# Patient Record
Sex: Female | Born: 1975 | Race: Black or African American | Hispanic: No | Marital: Single | Smoking: Never smoker
Health system: Southern US, Community
[De-identification: ages and names within clinical notes are randomized; demographics above are authoritative.]

## PROBLEM LIST (undated history)

## (undated) DIAGNOSIS — I10 Essential (primary) hypertension: Secondary | ICD-10-CM

## (undated) HISTORY — DX: Essential (primary) hypertension: I10

## (undated) HISTORY — PX: CHOLECYSTECTOMY: SHX55

---

## 2009-11-21 ENCOUNTER — Emergency Department (HOSPITAL_BASED_OUTPATIENT_CLINIC_OR_DEPARTMENT_OTHER): Admission: EM | Admit: 2009-11-21 | Discharge: 2009-11-21 | Payer: Self-pay | Admitting: Emergency Medicine

## 2009-11-21 ENCOUNTER — Ambulatory Visit: Payer: Self-pay | Admitting: Diagnostic Radiology

## 2010-04-14 ENCOUNTER — Emergency Department (HOSPITAL_BASED_OUTPATIENT_CLINIC_OR_DEPARTMENT_OTHER)
Admission: EM | Admit: 2010-04-14 | Discharge: 2010-04-14 | Payer: Self-pay | Source: Home / Self Care | Admitting: Emergency Medicine

## 2010-06-12 LAB — COMPREHENSIVE METABOLIC PANEL
AST: 18 U/L (ref 0–37)
BUN: 9 mg/dL (ref 6–23)
Calcium: 8.9 mg/dL (ref 8.4–10.5)
Chloride: 106 mEq/L (ref 96–112)
Creatinine, Ser: 0.7 mg/dL (ref 0.4–1.2)
Potassium: 3.5 mEq/L (ref 3.5–5.1)
Sodium: 143 mEq/L (ref 135–145)

## 2010-06-12 LAB — URINALYSIS, ROUTINE W REFLEX MICROSCOPIC
Bilirubin Urine: NEGATIVE
pH: 6 (ref 5.0–8.0)

## 2010-06-12 LAB — CBC
MCH: 23.3 pg — ABNORMAL LOW (ref 26.0–34.0)
MCV: 70.1 fL — ABNORMAL LOW (ref 78.0–100.0)
Platelets: 314 10*3/uL (ref 150–400)
RDW: 14.4 % (ref 11.5–15.5)

## 2010-06-12 LAB — URINE MICROSCOPIC-ADD ON

## 2010-06-12 LAB — DIFFERENTIAL
Eosinophils Absolute: 0.3 10*3/uL (ref 0.0–0.7)
Lymphs Abs: 2.7 10*3/uL (ref 0.7–4.0)
Monocytes Absolute: 0.5 10*3/uL (ref 0.1–1.0)
Monocytes Relative: 6 % (ref 3–12)

## 2010-06-12 LAB — LIPASE, BLOOD: Lipase: 93 U/L (ref 23–300)

## 2011-04-10 IMAGING — CR DG LUMBAR SPINE COMPLETE 4+V
5 series · 5 of 5 positions shown · non-contrast
Comparison: None.

CLINICAL DATA: Low back pain after falling downstairs.

LUMBAR SPINE - COMPLETE 4+ VIEW

[t l-spine a.p.]
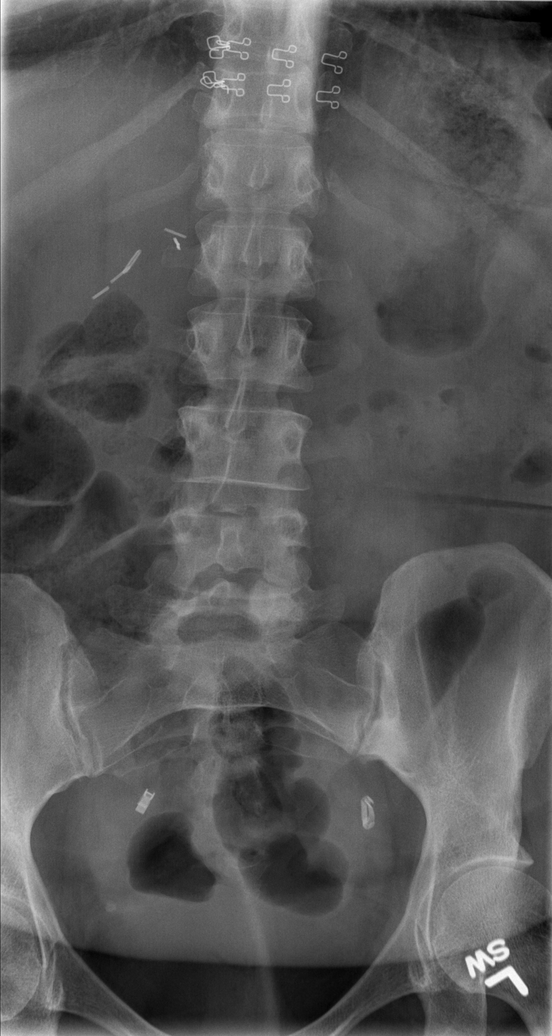

[t l-spine oblique exposure (1 of 2)]
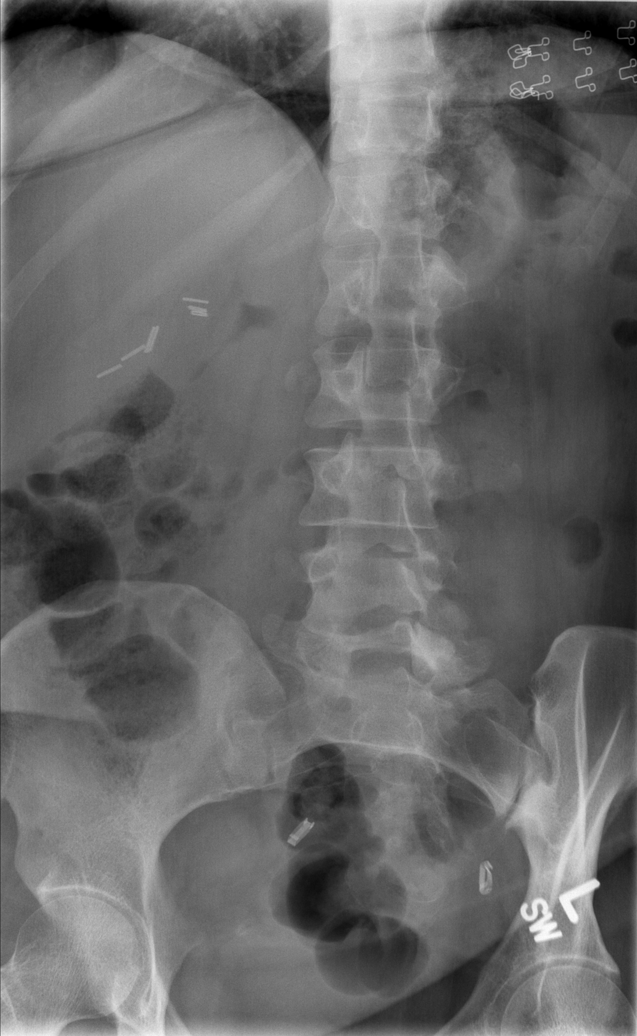

[t l-spine oblique exposure (2 of 2)]
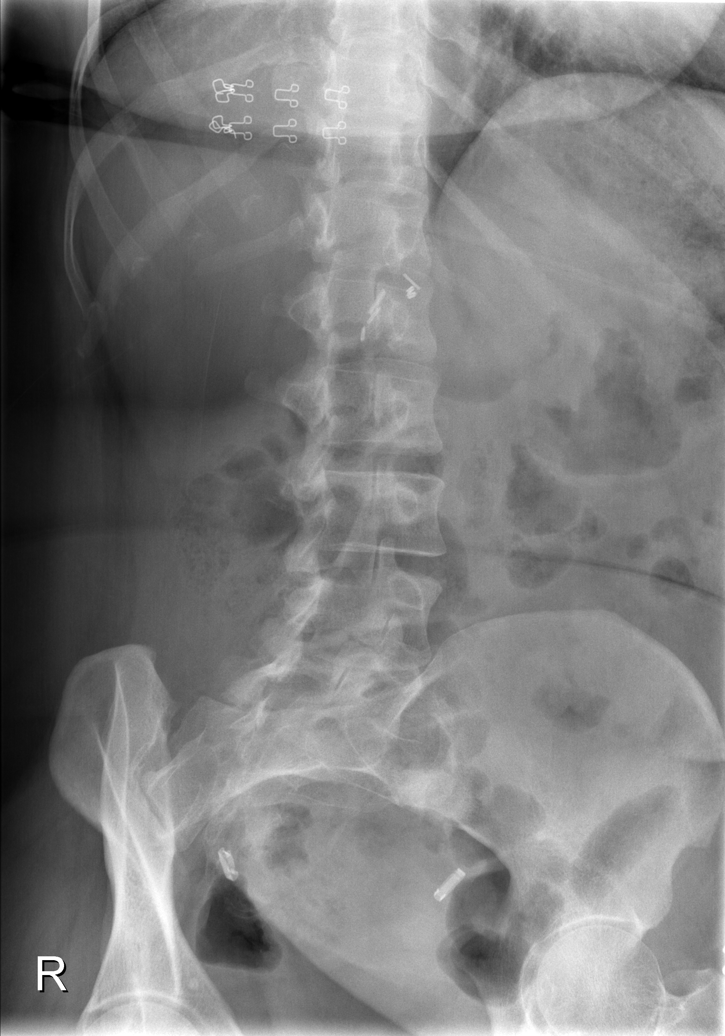

[t l-spine lat]
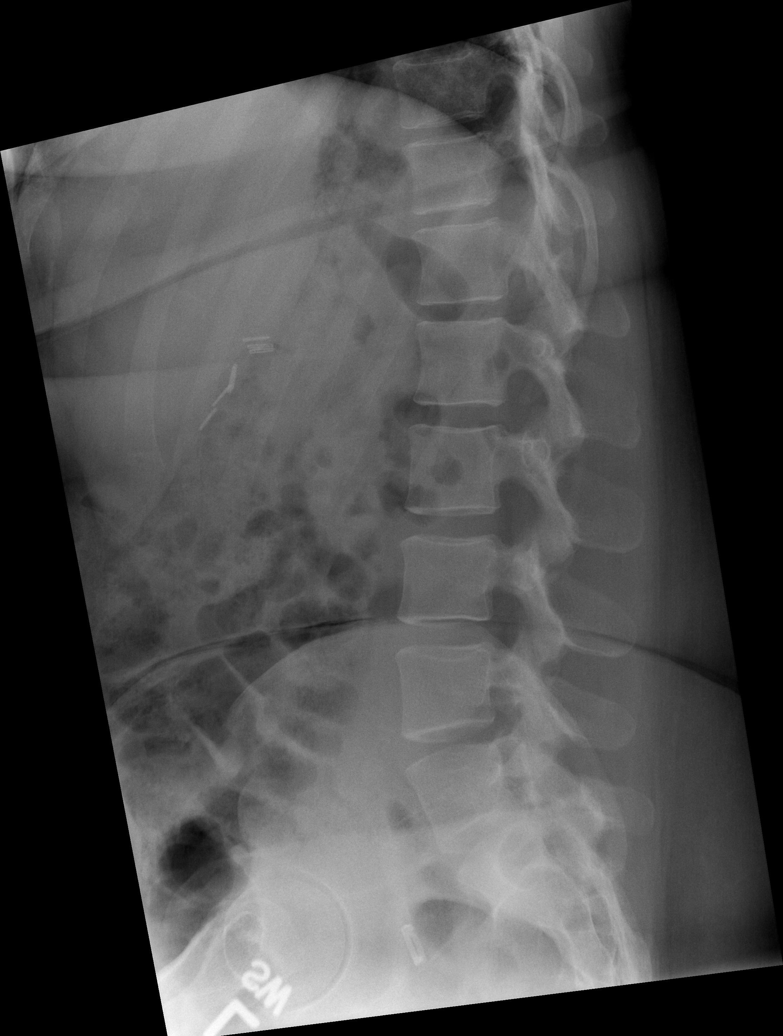

[t l-spine l5-s1 spot]
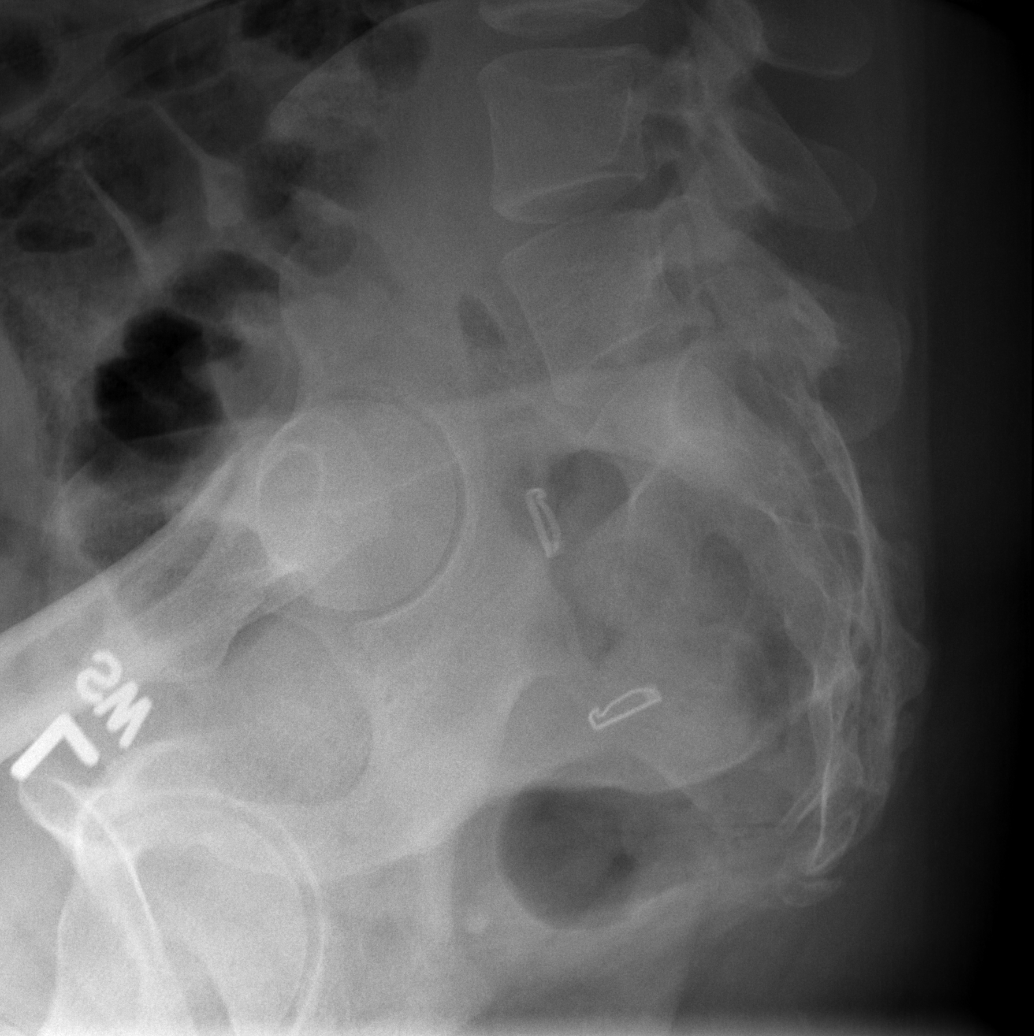

[5 of 5 positions shown; findings below may reference images not displayed]

FINDINGS: No acute fracture or subluxation identified.  There is
mild disc space narrowing at L5-S1 with associated facet
hypertrophy.  No bony lesions.
IMPRESSION: No acute fracture identified.  Mild spondylosis at L5-S1.

## 2011-09-11 IMAGING — CR DG CHEST 2V
2 series · 2 of 2 positions shown · non-contrast
Comparison: None.

CLINICAL DATA: Left-sided abdominal pain.

CHEST - 2 VIEW 11/21/2009:

[w chest pa]
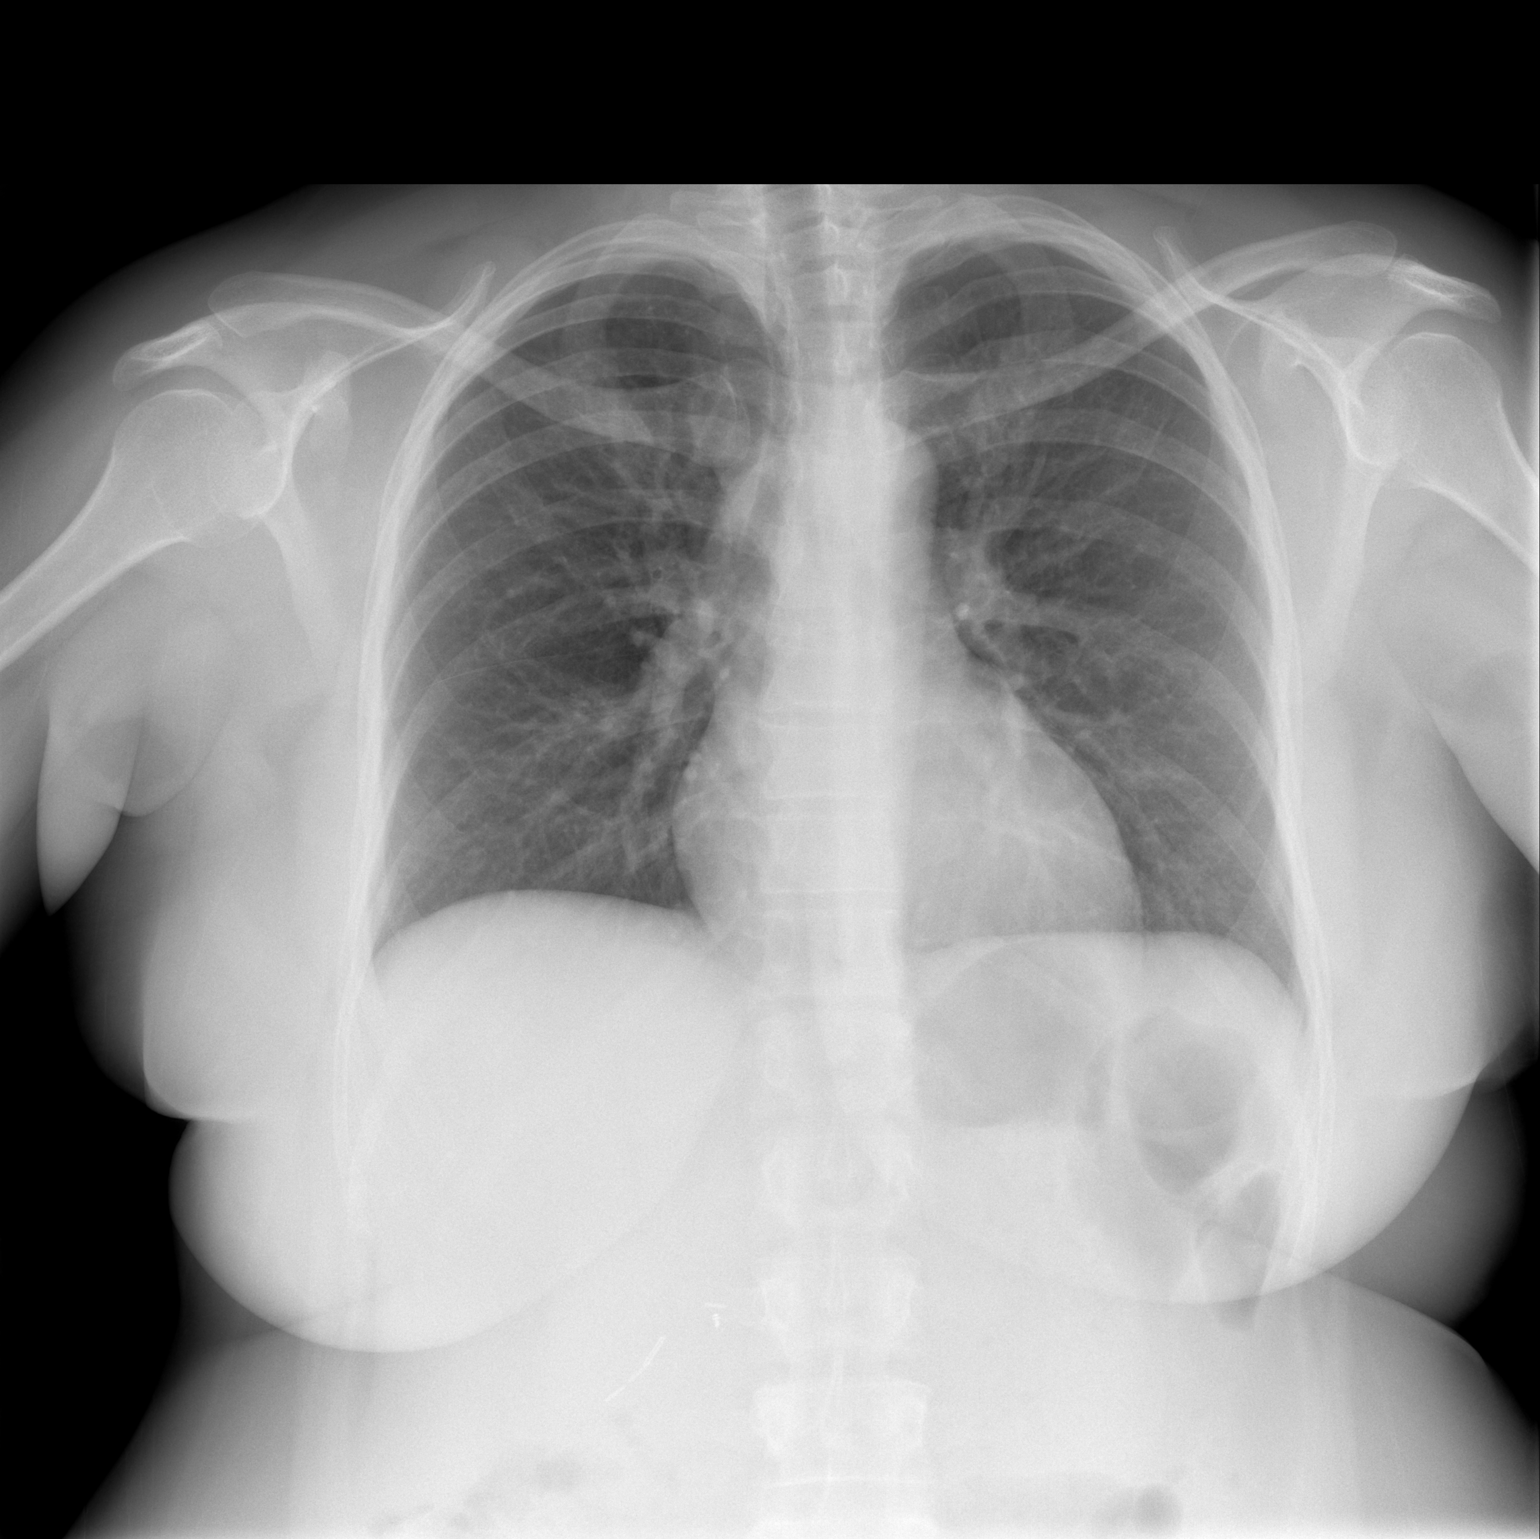

[w chest lat]
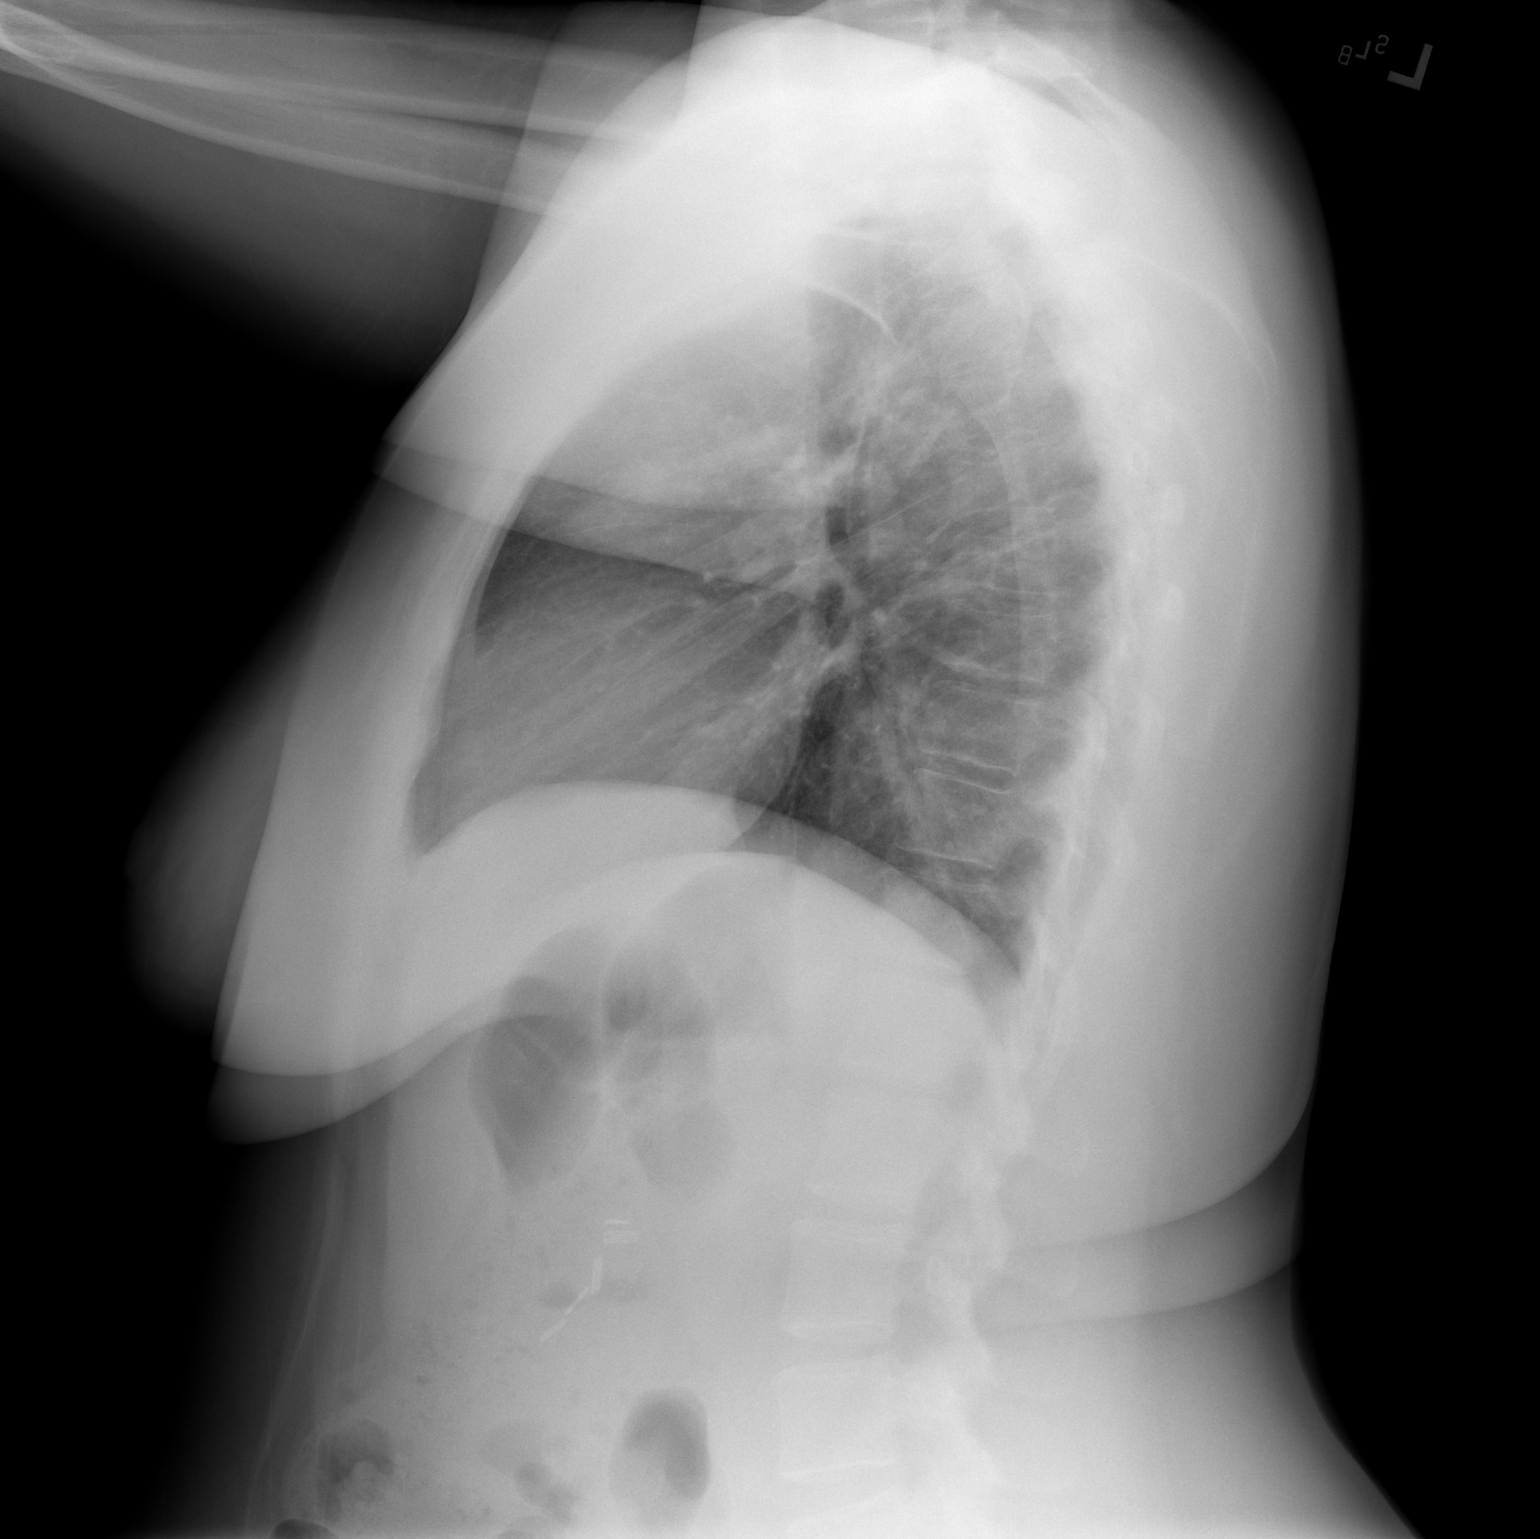

[2 of 2 positions shown; findings below may reference images not displayed]

FINDINGS: Cardiomediastinal silhouette unremarkable.  Lungs clear.
Bronchovascular markings normal.  Pulmonary vascularity normal.  No
pleural effusions.  No pneumothorax.  Visualized bony thorax
intact.
IMPRESSION: Normal chest x-ray.

## 2011-11-01 ENCOUNTER — Emergency Department (HOSPITAL_BASED_OUTPATIENT_CLINIC_OR_DEPARTMENT_OTHER)
Admission: EM | Admit: 2011-11-01 | Discharge: 2011-11-01 | Disposition: A | Discharge status: Home / Self Care | Payer: Worker's Compensation | Attending: Emergency Medicine | Admitting: Emergency Medicine

## 2011-11-01 ENCOUNTER — Encounter (HOSPITAL_BASED_OUTPATIENT_CLINIC_OR_DEPARTMENT_OTHER): Payer: Self-pay | Admitting: *Deleted

## 2011-11-01 DIAGNOSIS — W461XXA Contact with contaminated hypodermic needle, initial encounter: Secondary | ICD-10-CM

## 2011-11-01 DIAGNOSIS — Z7721 Contact with and (suspected) exposure to potentially hazardous body fluids: Secondary | ICD-10-CM | POA: Insufficient documentation

## 2011-11-01 NOTE — ED Notes (Signed)
Spoke to [facility] at [facility] hospital about whether we draw the post-exposure lab work. Stated that we do not draw the labs or treat for a post exposure for another facility.. The facility needs to f/u with their occupational health as needed, and wait to see what the nursing home patient's (the one who's needle stuck ms. Sherryll Burger) lab work results show. ED providers made aware.

## 2011-11-01 NOTE — ED Notes (Signed)
Patient reports that after administering insulin to resident this pm she stuck that same needle into left thumb. Patient reports that it did bleed and she washed the site well. Employer sent patient here for possible collection of exposure labs. No complaints

## 2011-11-01 NOTE — ED Provider Notes (Signed)
History     CSN: [id number]  Arrival date & time 11/01/11  2128   First MD Initiated Contact with Patient 11/01/11 2211      Chief Complaint  Patient presents with  . 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 She is unaware of the hepatitis and HIV status of the patient. Her supervisor advised her to come to [facility] to get blood drawn. She denies any current fever, nausea, vomiting, diarrhea.    History reviewed. No pertinent past medical history.  Past Surgical History  Procedure Date  . Cholecystectomy     No family history on file.  History  Substance Use Topics  . Smoking status: Never Smoker   . Smokeless tobacco: Not on file  . Alcohol Use: No    OB History    Grav Para Term Preterm Abortions TAB SAB Ect Mult Living                  Review of Systems  Constitutional: Negative for fever and chills.  Eyes: Negative for visual disturbance.  Respiratory: Negative for shortness of breath.   Cardiovascular: Negative for chest pain.  Gastrointestinal: Negative for nausea, vomiting, abdominal pain and diarrhea.  Skin: Positive for wound.  Neurological: Negative for headaches.    Allergies  Penicillins  Home Medications   Current Outpatient Rx  Name Route Sig Dispense Refill  . ADULT MULTIVITAMIN W/MINERALS CH Oral Take 1 tablet by mouth daily.      BP 131/74  Pulse 80  Temp 99 F (37.2 C) (Oral)  Resp 16  Ht 5' 2.5" (1.588 m)  Wt 180 lb (81.647 kg)  BMI 32.40 kg/m2  SpO2 98%  Physical Exam  Constitutional: She appears well-developed and well-nourished. No distress.  HENT:  Head: Normocephalic and atraumatic.  Eyes: Conjunctivae are normal. No scleral icterus.  Neck: Normal range  of motion.  Cardiovascular: Normal rate and regular rhythm.  Exam reveals no gallop and no friction rub.   No murmur heard. Pulmonary/Chest: Effort normal. No respiratory distress. She has no wheezes.  Musculoskeletal: Normal range of motion.  Neurological: She is alert.  Skin: Skin is warm. She is not diaphoretic.  Psychiatric: She has a normal mood and affect. Her behavior is normal.    ED Course  Procedures (including critical care time)  Labs Reviewed - No data to display No results found.   No diagnosis found.    MDM  10:55 PM I spoke with the charge nurse about evaluating this patient for hepatitis/HIV after a dirty needle stick. This facility does not evaluate needle sticks from other facilities or do HIV testing. I will advise the patient to follow up with her facility's policy regarding a needle stick and to follow up with the health department if needed. We cannot evaluate this patient's hepatitis/HIV status so she will be discharged and advised to follow up with her facility's policy. I will provide Health Department contact info. Plan discussed with Dr. Karma Ganja who is agreeable.         Emilia Beck, PA-C 11/01/11 2258

## 2011-11-01 NOTE — ED Notes (Signed)
Stuck her left thumb with an insulin syringe after giving patient an injection.

## 2011-11-01 NOTE — ED Provider Notes (Signed)
Medical screening examination/treatment/procedure(s) were performed by non-physician practitioner and as supervising physician I was immediately available for consultation/collaboration.  Ethelda Chick, MD 11/01/11 [phone]

## 2015-04-16 ENCOUNTER — Emergency Department (HOSPITAL_BASED_OUTPATIENT_CLINIC_OR_DEPARTMENT_OTHER)
Admission: EM | Admit: 2015-04-16 | Discharge: 2015-04-16 | Disposition: A | Discharge status: Home / Self Care | Payer: Self-pay | Attending: Emergency Medicine | Admitting: Emergency Medicine

## 2015-04-16 ENCOUNTER — Encounter (HOSPITAL_BASED_OUTPATIENT_CLINIC_OR_DEPARTMENT_OTHER): Payer: Self-pay

## 2015-04-16 DIAGNOSIS — Z79899 Other long term (current) drug therapy: Secondary | ICD-10-CM | POA: Insufficient documentation

## 2015-04-16 DIAGNOSIS — Z880 Allergy status to penicillin: Secondary | ICD-10-CM | POA: Insufficient documentation

## 2015-04-16 DIAGNOSIS — Z3202 Encounter for pregnancy test, result negative: Secondary | ICD-10-CM | POA: Insufficient documentation

## 2015-04-16 DIAGNOSIS — R5383 Other fatigue: Secondary | ICD-10-CM | POA: Insufficient documentation

## 2015-04-16 DIAGNOSIS — R51 Headache: Secondary | ICD-10-CM | POA: Insufficient documentation

## 2015-04-16 LAB — PREGNANCY, URINE: PREG TEST UR: NEGATIVE

## 2015-04-16 LAB — URINE MICROSCOPIC-ADD ON

## 2015-04-16 LAB — CBC WITH DIFFERENTIAL/PLATELET
BASOS ABS: 0.1 10*3/uL (ref 0.0–0.1)
Basophils Relative: 1 %
EOS ABS: 0.2 10*3/uL (ref 0.0–0.7)
Eosinophils Relative: 2 %
HCT: 36.1 % (ref 36.0–46.0)
Hemoglobin: 11.5 g/dL — ABNORMAL LOW (ref 12.0–15.0)
LYMPHS ABS: 3.3 10*3/uL (ref 0.7–4.0)
Lymphocytes Relative: 44 %
MCH: 21.8 pg — ABNORMAL LOW (ref 26.0–34.0)
MCHC: 31.9 g/dL (ref 30.0–36.0)
MCV: 68.5 fL — AB (ref 78.0–100.0)
MONO ABS: 0.5 10*3/uL (ref 0.1–1.0)
Monocytes Relative: 7 %
NEUTROS PCT: 46 %
Neutro Abs: 3.5 10*3/uL (ref 1.7–7.7)
PLATELETS: 384 10*3/uL (ref 150–400)
RBC: 5.27 MIL/uL — AB (ref 3.87–5.11)
RDW: 15.7 % — AB (ref 11.5–15.5)
WBC: 7.6 10*3/uL (ref 4.0–10.5)

## 2015-04-16 LAB — BASIC METABOLIC PANEL
ANION GAP: 6 (ref 5–15)
BUN: 10 mg/dL (ref 6–20)
CALCIUM: 9.3 mg/dL (ref 8.9–10.3)
CO2: 28 mmol/L (ref 22–32)
Chloride: 106 mmol/L (ref 101–111)
Creatinine, Ser: 0.66 mg/dL (ref 0.44–1.00)
GLUCOSE: 81 mg/dL (ref 65–99)
Potassium: 3.8 mmol/L (ref 3.5–5.1)
SODIUM: 140 mmol/L (ref 135–145)

## 2015-04-16 LAB — URINALYSIS, ROUTINE W REFLEX MICROSCOPIC
BILIRUBIN URINE: NEGATIVE
Glucose, UA: NEGATIVE mg/dL
Hgb urine dipstick: NEGATIVE
KETONES UR: NEGATIVE mg/dL
NITRITE: NEGATIVE
Protein, ur: NEGATIVE mg/dL
Specific Gravity, Urine: 1.018 (ref 1.005–1.030)
pH: 7.5 (ref 5.0–8.0)

## 2015-04-16 NOTE — ED Provider Notes (Signed)
CSN: [id number]     Arrival date & time 04/16/15  2671 History   First MD Initiated Contact with Patient 04/16/15 1016     Chief Complaint  Patient presents with  . Headache  . Fatigue      HPI  Recent presents for evaluation of fatigue for the last several days and intermittent headache. Does not have a headache today. Had been told that she was anemic and placed on iron pills. She stopped taking those 2-3 days ago. No symptoms of that above. Does not have sore throat. No vision changes no pain no sore throat. No cough or difficulty breathing. No heavy vaginal bleeding. No nausea vomiting diarrhea and no skin rash joint pain or other complaints. Fatigue described as "just more tired than usual.  History reviewed. No pertinent past medical history. Past Surgical History  Procedure Laterality Date  . Cholecystectomy     History reviewed. No pertinent family history. Social History  Substance Use Topics  . Smoking status: Never Smoker   . Smokeless tobacco: None  . Alcohol Use: No   OB History    No data available     Review of Systems  Constitutional: Positive for fatigue. Negative for fever, chills, diaphoresis and appetite change.  HENT: Negative for mouth sores, sore throat and trouble swallowing.   Eyes: Negative for visual disturbance.  Respiratory: Negative for cough, chest tightness, shortness of breath and wheezing.   Cardiovascular: Negative for chest pain.  Gastrointestinal: Negative for nausea, vomiting, abdominal pain, diarrhea and abdominal distention.  Endocrine: Negative for polydipsia, polyphagia and polyuria.  Genitourinary: Negative for dysuria, frequency and hematuria.  Musculoskeletal: Negative for gait problem.  Skin: Negative for color change, pallor and rash.  Neurological: Positive for headaches. Negative for dizziness, syncope and light-headedness.  Hematological: Does not bruise/bleed easily.  Psychiatric/Behavioral: Negative for behavioral problems  and confusion.      Allergies  Penicillins  Home Medications   Prior to Admission medications   Medication Sig Start Date End Date Taking? Authorizing Provider  Multiple Vitamin (MULTIVITAMIN WITH MINERALS) TABS Take 1 tablet by mouth daily.    Historical Provider, MD   BP 124/85 mmHg  Pulse 73  Temp(Src) 98.5 F (36.9 C) (Oral)  Resp 14  Ht  (1.575 m)  Wt 194 lb (87.998 kg)  BMI 35.47 kg/m2  SpO2 98%  LMP 04/02/2015 Physical Exam  Constitutional: She is oriented to person, place, and time. She appears well-developed and well-nourished. No distress.  HENT:  Head: Normocephalic.  Normal HEENT exam. Conjunctiva not pale. Normal pharynx. Normal TMs.  Eyes: Conjunctivae are normal. Pupils are equal, round, and reactive to light. No scleral icterus.  Neck: Normal range of motion. Neck supple. No thyromegaly present.  Cardiovascular: Normal rate and regular rhythm.  Exam reveals no gallop and no friction rub.   No murmur heard. Pulmonary/Chest: Effort normal and breath sounds normal. No respiratory distress. She has no wheezes. She has no rales.  Abdominal: Soft. Bowel sounds are normal. She exhibits no distension. There is no tenderness. There is no rebound.  Musculoskeletal: Normal range of motion.  Neurological: She is alert and oriented to person, place, and time.  Skin: Skin is warm and dry. No rash noted.  Psychiatric: She has a normal mood and affect. Her behavior is normal.    ED Course  Procedures (including critical care time) Labs Review Labs Reviewed  URINALYSIS, ROUTINE W REFLEX MICROSCOPIC (NOT AT [facility]) - Abnormal; Notable for the following:  APPearance CLOUDY (*)    Leukocytes, UA SMALL (*)    All other components within normal limits  CBC WITH DIFFERENTIAL/PLATELET - Abnormal; Notable for the following:    RBC 5.27 (*)    Hemoglobin 11.5 (*)    MCV 68.5 (*)    MCH 21.8 (*)    RDW 15.7 (*)    All other components within normal limits  URINE  MICROSCOPIC-ADD ON - Abnormal; Notable for the following:    Squamous Epithelial / LPF 6-30 (*)    Bacteria, UA FEW (*)    All other components within normal limits  PREGNANCY, URINE  BASIC METABOLIC PANEL    Imaging Review No results found. I have personally reviewed and evaluated these images and lab results as part of my medical decision-making.   EKG Interpretation None      MDM   Final diagnoses:  Other fatigue    No specific diagnosis, or treatment recommendation.    Rolland Porter, MD 04/16/15 1242

## 2015-04-16 NOTE — ED Notes (Signed)
MD James at bedside.  

## 2015-04-16 NOTE — ED Notes (Signed)
Pt reports HA and fatigue x 3-4 days. Denies HA at this time, reports having it this morning. Sts stopped taking iron pills yesterday, hx of anemia. No other symptoms.

## 2015-04-16 NOTE — ED Notes (Signed)
Pt attempting to obtain urine specimen at this time.

## 2015-04-16 NOTE — Discharge Instructions (Signed)
Fatigue  Fatigue is feeling tired all of the time, a lack of energy, or a lack of motivation. Occasional or mild fatigue is often a normal response to activity or life in general. However, long-lasting (chronic) or extreme fatigue may indicate an underlying medical condition.  HOME CARE INSTRUCTIONS   Watch your fatigue for any changes. The following actions may help to lessen any discomfort you are feeling:  · Talk to your health care provider about how much sleep you need each night. Try to get the required amount every night.  · Take medicines only as directed by your health care provider.  · Eat a healthy and nutritious diet. Ask your health care provider if you need help changing your diet.  · Drink enough fluid to keep your urine clear or pale yellow.  · Practice ways of relaxing, such as yoga, meditation, massage therapy, or acupuncture.  · Exercise regularly.    · Change situations that cause you stress. Try to keep your work and personal routine reasonable.  · Do not abuse illegal drugs.  · Limit alcohol intake to no more than 1 drink per day for nonpregnant women and 2 drinks per day for men. One drink equals 12 ounces of beer, 5 ounces of wine, or 1½ ounces of hard liquor.  · Take a multivitamin, if directed by your health care provider.  SEEK MEDICAL CARE IF:   · Your fatigue does not get better.  · You have a fever.    · You have unintentional weight loss or gain.  · You have headaches.    · You have difficulty:      Falling asleep.    Sleeping throughout the night.  · You feel angry, guilty, anxious, or sad.     · You are unable to have a bowel movement (constipation).    · You skin is dry.     · Your legs or another part of your body is swollen.    SEEK IMMEDIATE MEDICAL CARE IF:   · You feel confused.    · Your vision is blurry.  · You feel faint or pass out.    · You have a severe headache.    · You have severe abdominal, pelvic, or back pain.    · You have chest pain, shortness of breath, or an  irregular or fast heartbeat.    · You are unable to urinate or you urinate less than normal.    · You develop abnormal bleeding, such as bleeding from the rectum, vagina, nose, lungs, or nipples.  · You vomit blood.     · You have thoughts about harming yourself or committing suicide.    · You are worried that you might harm someone else.       This information is not intended to replace advice given to you by your health care provider. Make sure you discuss any questions you have with your health care provider.     Document Released: 01/10/2007 Document Revised: 04/05/2014 Document Reviewed: 07/17/2013  [employer] Interactive Patient Education ©2016 [employer]

## 2022-09-11 ENCOUNTER — Emergency Department (HOSPITAL_BASED_OUTPATIENT_CLINIC_OR_DEPARTMENT_OTHER): Payer: PRIVATE HEALTH INSURANCE

## 2022-09-11 ENCOUNTER — Encounter (HOSPITAL_BASED_OUTPATIENT_CLINIC_OR_DEPARTMENT_OTHER): Payer: Self-pay

## 2022-09-11 ENCOUNTER — Other Ambulatory Visit: Payer: Self-pay

## 2022-09-11 ENCOUNTER — Emergency Department (HOSPITAL_BASED_OUTPATIENT_CLINIC_OR_DEPARTMENT_OTHER)
Admission: EM | Admit: 2022-09-11 | Discharge: 2022-09-11 | Disposition: A | Discharge status: Home / Self Care | Payer: PRIVATE HEALTH INSURANCE | Attending: Emergency Medicine | Admitting: Emergency Medicine

## 2022-09-11 DIAGNOSIS — M79672 Pain in left foot: Secondary | ICD-10-CM | POA: Insufficient documentation

## 2022-09-11 DIAGNOSIS — R0789 Other chest pain: Secondary | ICD-10-CM

## 2022-09-11 DIAGNOSIS — Y92410 Unspecified street and highway as the place of occurrence of the external cause: Secondary | ICD-10-CM | POA: Diagnosis not present

## 2022-09-11 DIAGNOSIS — R1031 Right lower quadrant pain: Secondary | ICD-10-CM | POA: Diagnosis not present

## 2022-09-11 LAB — BASIC METABOLIC PANEL
Anion gap: 7 (ref 5–15)
BUN: 14 mg/dL (ref 6–20)
CO2: 25 mmol/L (ref 22–32)
Calcium: 9.3 mg/dL (ref 8.9–10.3)
Chloride: 106 mmol/L (ref 98–111)
Creatinine, Ser: 0.69 mg/dL (ref 0.44–1.00)
GFR, Estimated: >60 mL/min (ref >60)
Glucose, Bld: 89 mg/dL (ref 70–99)
Potassium: 3.5 mmol/L (ref 3.5–5.1)
Sodium: 138 mmol/L (ref 135–145)

## 2022-09-11 LAB — CBC WITH DIFFERENTIAL/PLATELET
Abs Immature Granulocytes: 0.02 10*3/uL (ref 0.00–0.07)
Basophils Absolute: 0.1 10*3/uL (ref 0.0–0.1)
Basophils Relative: 1 %
Eosinophils Absolute: 0.1 10*3/uL (ref 0.0–0.5)
Eosinophils Relative: 2 %
HCT: 36.3 % (ref 36.0–46.0)
Hemoglobin: 11.3 g/dL — ABNORMAL LOW (ref 12.0–15.0)
Immature Granulocytes: 0 %
Lymphocytes Relative: 40 %
Lymphs Abs: 3 10*3/uL (ref 0.7–4.0)
MCH: 22.4 pg — ABNORMAL LOW (ref 26.0–34.0)
MCHC: 31.1 g/dL (ref 30.0–36.0)
MCV: 71.9 fL — ABNORMAL LOW (ref 80.0–100.0)
Monocytes Absolute: 0.6 10*3/uL (ref 0.1–1.0)
Monocytes Relative: 8 %
Neutro Abs: 3.7 10*3/uL (ref 1.7–7.7)
Neutrophils Relative %: 49 %
Platelets: 333 10*3/uL (ref 150–400)
RBC: 5.05 MIL/uL (ref 3.87–5.11)
RDW: 15.4 % (ref 11.5–15.5)
WBC: 7.5 10*3/uL (ref 4.0–10.5)
nRBC: 0 % (ref 0.0–0.2)

## 2022-09-11 LAB — PREGNANCY, URINE: Preg Test, Ur: NEGATIVE

## 2022-09-11 MED ORDER — ACETAMINOPHEN 500 MG PO TABS
1000.0000 mg | ORAL_TABLET | Freq: Once | ORAL | Status: AC
  Administered 2022-09-11: 1000 mg via ORAL
  Filled 2022-09-11: qty 2

## 2022-09-11 MED ORDER — LIDOCAINE 5 % EX PTCH: 1.0000 | MEDICATED_PATCH | CUTANEOUS | 0 refills | Status: DC

## 2022-09-11 MED ORDER — CYCLOBENZAPRINE HCL 10 MG PO TABS: 10.0000 mg | ORAL_TABLET | Freq: Two times a day (BID) | ORAL | 0 refills | Status: DC | PRN

## 2022-09-11 MED ORDER — IOHEXOL 300 MG/ML  SOLN
100.0000 mL | Freq: Once | INTRAMUSCULAR | Status: AC | PRN
  Administered 2022-09-11: 100 mL via INTRAVENOUS

## 2022-09-11 MED ORDER — LIDOCAINE 5 % EX PTCH
1.0000 | MEDICATED_PATCH | Freq: Once | CUTANEOUS | Status: DC
  Administered 2022-09-11: 1 via TRANSDERMAL
  Filled 2022-09-11: qty 1

## 2022-09-11 MED ORDER — LIDOCAINE 5 % EX PTCH: 1.0000 | MEDICATED_PATCH | CUTANEOUS | 0 refills | Status: AC

## 2022-09-11 MED ORDER — CYCLOBENZAPRINE HCL 10 MG PO TABS: 10.0000 mg | ORAL_TABLET | Freq: Two times a day (BID) | ORAL | 0 refills | Status: AC | PRN
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
## 2022-09-11 NOTE — Discharge Instructions (Addendum)
It was a pleasure taking care of you today!  Your imaging in the ED was negative for fracture or dislocations. You will feel more sore in the morning. You are prescribed Flexeril (muscle relaxer). Do not drive or operate heavy machinery while taking the muscle relaxer as it can make you sleepy/drowsy. You will be sent a prescription for a lidoderm patch, remove and replace with a new patch every 12 hours. You may take over the counter 600 mg Ibuprofen every 6 hours and alternate with 500 mg Tylenol every 6 hours as needed for pain for no more than 7 days. You may apply ice or heat to affected area for up to 15 minutes at a time. Ensure to place a barrier between your skin and the ice/heat. Follow up with your primary care provider as needed. Return to the Emergency Department if you are experiencing increasing/worsening symptoms.
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
